# Patient Record
Sex: Male | Born: 1980 | Race: White | Hispanic: No | Marital: Married | Smoking: Never smoker
Health system: Southern US, Community
[De-identification: ages and names within clinical notes are randomized; demographics above are authoritative.]

---

## 2000-05-24 ENCOUNTER — Encounter: Payer: Self-pay | Admitting: *Deleted

## 2000-05-24 ENCOUNTER — Observation Stay (HOSPITAL_COMMUNITY): Admission: EM | Admit: 2000-05-24 | Discharge: 2000-05-25 | Payer: Self-pay | Admitting: Emergency Medicine

## 2016-09-07 DIAGNOSIS — D485 Neoplasm of uncertain behavior of skin: Secondary | ICD-10-CM | POA: Diagnosis not present

## 2016-09-07 DIAGNOSIS — D229 Melanocytic nevi, unspecified: Secondary | ICD-10-CM | POA: Diagnosis not present

## 2016-09-25 DIAGNOSIS — D485 Neoplasm of uncertain behavior of skin: Secondary | ICD-10-CM | POA: Diagnosis not present

## 2016-10-30 DIAGNOSIS — L988 Other specified disorders of the skin and subcutaneous tissue: Secondary | ICD-10-CM | POA: Diagnosis not present

## 2016-10-30 DIAGNOSIS — D485 Neoplasm of uncertain behavior of skin: Secondary | ICD-10-CM | POA: Diagnosis not present

## 2016-11-13 DIAGNOSIS — D485 Neoplasm of uncertain behavior of skin: Secondary | ICD-10-CM | POA: Diagnosis not present

## 2017-03-26 ENCOUNTER — Other Ambulatory Visit: Payer: Self-pay | Admitting: Internal Medicine

## 2017-03-26 DIAGNOSIS — R1032 Left lower quadrant pain: Secondary | ICD-10-CM

## 2017-03-26 DIAGNOSIS — Z23 Encounter for immunization: Secondary | ICD-10-CM | POA: Diagnosis not present

## 2017-04-05 ENCOUNTER — Ambulatory Visit
Admission: RE | Admit: 2017-04-05 | Discharge: 2017-04-05 | Disposition: A | Discharge status: Home / Self Care | Payer: BLUE CROSS/BLUE SHIELD | Source: Ambulatory Visit | Attending: Internal Medicine | Admitting: Internal Medicine

## 2017-04-05 DIAGNOSIS — R1032 Left lower quadrant pain: Secondary | ICD-10-CM | POA: Diagnosis not present

## 2017-04-05 MED ORDER — IOPAMIDOL (ISOVUE-300) INJECTION 61%
100.0000 mL | Freq: Once | INTRAVENOUS | Status: AC | PRN
  Administered 2017-04-05: 100 mL via INTRAVENOUS

## 2017-07-18 ENCOUNTER — Encounter: Payer: Self-pay | Admitting: Medical

## 2017-07-18 ENCOUNTER — Ambulatory Visit: Payer: Self-pay | Admitting: Medical

## 2017-07-18 VITALS — BP 115/80 | HR 60 | Temp 97.2°F | Resp 16 | Ht 72.0 in | Wt 196.0 lb

## 2017-07-18 DIAGNOSIS — J0100 Acute maxillary sinusitis, unspecified: Secondary | ICD-10-CM

## 2017-07-18 DIAGNOSIS — H6983 Other specified disorders of Eustachian tube, bilateral: Secondary | ICD-10-CM

## 2017-07-18 MED ORDER — PREDNISONE 10 MG (21) PO TBPK: ORAL_TABLET | ORAL | 0 refills | Status: AC

## 2017-07-18 MED ORDER — AMOXICILLIN-POT CLAVULANATE 875-125 MG PO TABS: 1.0000 | ORAL_TABLET | Freq: Two times a day (BID) | ORAL | 0 refills | Status: AC

## 2017-07-18 NOTE — Patient Instructions (Signed)
Eustachian Tube Dysfunction The eustachian tube connects the middle ear to the back of the nose. It regulates air pressure in the middle ear by allowing air to move between the ear and nose. It also helps to drain fluid from the middle ear space. When the eustachian tube does not function properly, air pressure, fluid, or both can build up in the middle ear. Eustachian tube dysfunction can affect one or both ears. What are the causes? This condition happens when the eustachian tube becomes blocked or cannot open normally. This may result from:  Ear infections.  Colds and other upper respiratory infections.  Allergies.  Irritation, such as from cigarette smoke or acid from the stomach coming up into the esophagus (gastroesophageal reflux).  Sudden changes in air pressure, such as from descending in an airplane.  Abnormal growths in the nose or throat, such as nasal polyps, tumors, or enlarged tissue at the back of the throat (adenoids).  What increases the risk? This condition may be more likely to develop in people who smoke and people who are overweight. Eustachian tube dysfunction may also be more likely to develop in children, especially children who have:  Certain birth defects of the mouth, such as cleft palate.  Large tonsils and adenoids.  What are the signs or symptoms? Symptoms of this condition may include:  A feeling of fullness in the ear.  Ear pain.  Clicking or popping noises in the ear.  Ringing in the ear.  Hearing loss.  Loss of balance.  Symptoms may get worse when the air pressure around you changes, such as when you travel to an area of high elevation or fly on an airplane. How is this diagnosed? This condition may be diagnosed based on:  Your symptoms.  A physical exam of your ear, nose, and throat.  Tests, such as those that measure: ? The movement of your eardrum (tympanogram). ? Your hearing (audiometry).  How is this treated? Treatment  depends on the cause and severity of your condition. If your symptoms are mild, you may be able to relieve your symptoms by moving air into ("popping") your ears. If you have symptoms of fluid in your ears, treatment may include:  Decongestants.  Antihistamines.  Nasal sprays or ear drops that contain medicines that reduce swelling (steroids).  In some cases, you may need to have a procedure to drain the fluid in your eardrum (myringotomy). In this procedure, a small tube is placed in the eardrum to:  Drain the fluid.  Restore the air in the middle ear space.  Follow these instructions at home:  Take over-the-counter and prescription medicines only as told by your health care provider.  Use techniques to help pop your ears as recommended by your health care provider. These may include: ? Chewing gum. ? Yawning. ? Frequent, forceful swallowing. ? Closing your mouth, holding your nose closed, and gently blowing as if you are trying to blow air out of your nose.  Do not do any of the following until your health care provider approves: ? Travel to high altitudes. ? Fly in airplanes. ? Work in a pressurized cabin or room. ? Scuba dive.  Keep your ears dry. Dry your ears completely after showering or bathing.  Do not smoke.  Keep all follow-up visits as told by your health care provider. This is important. Contact a health care provider if:  Your symptoms do not go away after treatment.  Your symptoms come back after treatment.  You are   unable to pop your ears.  You have: ? A fever. ? Pain in your ear. ? Pain in your head or neck. ? Fluid draining from your ear.  Your hearing suddenly changes.  You become very dizzy.  You lose your balance. This information is not intended to replace advice given to you by your health care provider. Make sure you discuss any questions you have with your health care provider. Document Released: 03/05/2015 Document Revised: 07/15/2015  Document Reviewed: 02/25/2014 Elsevier Interactive Patient Education  2018 [employer]. Allergies An allergy is when your body reacts to a substance in a way that is not normal. An allergic reaction can happen after you:  Eat something.  Breathe in something.  Touch something.  You can be allergic to:  Things that are only around during certain seasons, like molds and pollens.  Foods.  Drugs.  Insects.  Animal dander.  What are the signs or symptoms?  Puffiness (swelling). This may happen on the lips, face, tongue, mouth, or throat.  Sneezing.  Coughing.  Breathing loudly (wheezing).  Stuffy nose.  Tingling in the mouth.  A rash.  Itching.  Itchy, red, puffy areas of skin (hives).  Watery eyes.  Throwing up (vomiting).  Watery poop (diarrhea).  Dizziness.  Feeling faint or fainting.  Trouble breathing or swallowing.  A tight feeling in the chest.  A fast heartbeat. How is this diagnosed? Allergies can be diagnosed with:  A medical and family history.  Skin tests.  Blood tests.  A food diary. A food diary is a record of all the foods, drinks, and symptoms you have each day.  The results of an elimination diet. This diet involves making sure not to eat certain foods and then seeing what happens when you start eating them again.  How is this treated? There is no cure for allergies, but allergic reactions can be treated with medicine. Severe reactions usually need to be treated at a hospital. How is this prevented? The best way to prevent an allergic reaction is to avoid the thing you are allergic to. Allergy shots and medicines can also help prevent reactions in some cases. This information is not intended to replace advice given to you by your health care provider. Make sure you discuss any questions you have with your health care provider. Document Released: 06/03/2012 Document Revised: 10/04/2015 Document Reviewed: 11/18/2013 Elsevier  Interactive Patient Education  2018 [employer]. Sinusitis, Adult Sinusitis is soreness and inflammation of your sinuses. Sinuses are hollow spaces in the bones around your face. They are located:  Around your eyes.  In the middle of your forehead.  Behind your nose.  In your cheekbones.  Your sinuses and nasal passages are lined with a stringy fluid (mucus). Mucus normally drains out of your sinuses. When your nasal tissues get inflamed or swollen, the mucus can get trapped or blocked so air cannot flow through your sinuses. This lets bacteria, viruses, and funguses grow, and that leads to infection. Follow these instructions at home: Medicines  Take, use, or apply over-the-counter and prescription medicines only as told by your doctor. These may include nasal sprays.  If you were prescribed an antibiotic medicine, take it as told by your doctor. Do not stop taking the antibiotic even if you start to feel better. Hydrate and Humidify  Drink enough water to keep your pee (urine) clear or pale yellow.  Use a cool mist humidifier to keep the humidity level in your home above 50%.  Breathe in steam for 10-15 minutes, 3-4 times a day or as told by your doctor. You can do this in the bathroom while a hot shower is running.  Try not to spend time in cool or dry air. Rest  Rest as much as possible.  Sleep with your head raised (elevated).  Make sure to get enough sleep each night. General instructions  Put a warm, moist washcloth on your face 3-4 times a day or as told by your doctor. This will help with discomfort.  Wash your hands often with soap and water. If there is no soap and water, use hand sanitizer.  Do not smoke. Avoid being around people who are smoking (secondhand smoke).  Keep all follow-up visits as told by your doctor. This is important. Contact a doctor if:  You have a fever.  Your symptoms get worse.  Your symptoms do not get better within 10 days. Get  help right away if:  You have a very bad headache.  You cannot stop throwing up (vomiting).  You have pain or swelling around your face or eyes.  You have trouble seeing.  You feel confused.  Your neck is stiff.  You have trouble breathing. This information is not intended to replace advice given to you by your health care provider. Make sure you discuss any questions you have with your health care provider. Document Released: 07/26/2007 Document Revised: 10/03/2015 Document Reviewed: 12/02/2014 Elsevier Interactive Patient Education  [employer].

## 2017-07-18 NOTE — Progress Notes (Addendum)
   Subjective:    Patient ID: Robert Garrison, male    DOB: 02-05-1981, 37 y.o.   MRN: [id number]  HPI 37 yo male in non acute distress, presents today with  About one month ago, sinus congestion  Coughing up green only in the morning, no green discharge through out the day.  Started with Zyrtec two days ago. Feels like he is clogged up behind his nose.  Otherwise feels fine and is able to workout without problems.   Review of Systems  Constitutional: Positive for fever (Monday). Negative for chills and fatigue.  HENT: Positive for congestion and sore throat (scatchy today). Negative for ear discharge, ear pain, postnasal drip, sinus pressure, sinus pain and voice change.   Eyes: Negative for discharge and itching.  Respiratory: Negative for cough and shortness of breath.   Cardiovascular: Negative for chest pain and leg swelling.  Gastrointestinal: Negative for abdominal pain.  Endocrine: Negative for polydipsia, polyphagia and polyuria.  Genitourinary: Negative for dysuria.  Musculoskeletal: Negative for myalgias.  Skin: Negative for rash.  Allergic/Immunologic: Negative for environmental allergies and food allergies.  Neurological: Negative for dizziness, syncope and light-headedness.  Psychiatric/Behavioral: Negative for behavioral problems, self-injury and suicidal ideas.       Objective:   Physical Exam  Constitutional: He is oriented to person, place, and time. He appears well-developed and well-nourished.  HENT:  Head: Normocephalic and atraumatic.  Right Ear: Hearing, external ear and ear canal normal. A middle ear effusion is present.  Left Ear: Hearing, external ear and ear canal normal. A middle ear effusion is present.  Nose: Mucosal edema present.  Mouth/Throat: Oropharynx is clear and moist.  Eyes: Pupils are equal, round, and reactive to light. EOM and lids are normal. Right conjunctiva is injected. Left conjunctiva is injected.    Neck: Normal range of motion.  Neck supple.  Cardiovascular: Normal rate, regular rhythm and normal heart sounds.  Pulmonary/Chest: Effort normal and breath sounds normal.  Lymphadenopathy:    He has no cervical adenopathy.  Neurological: He is alert and oriented to person, place, and time.  Skin: Skin is warm and dry.  Psychiatric: He has a normal mood and affect. His behavior is normal. Judgment and thought content normal.  Nursing note and vitals reviewed.         Assessment & Plan:  Sinusitis that looks viral. Eustachian tube dysfunction bilateral.. Allergies - he works outside all day long.  Meds ordered this encounter  Medications  . amoxicillin-clavulanate (AUGMENTIN) 875-125 MG tablet    Sig: Take 1 tablet by mouth 2 (two) times daily.    Dispense:  20 tablet    Refill:  0  . predniSONE (STERAPRED UNI-PAK 21 TAB) 10 MG (21) TBPK tablet    Sig: Take 6 tablets by mouth today that 5 tablets tomorrow then one less each day thereafter.    Dispense:  21 tablet    Refill:  0  To start antibiotics if fever 100.5 or facial pain or green discharge.  He is traveling this weekend and wants to feel better and not worse so thought he would come in and get evaluated. Patient verbalizes understanding and has no questions at discharge. Return to clinic as needed.

## 2017-12-27 DIAGNOSIS — H20011 Primary iridocyclitis, right eye: Secondary | ICD-10-CM | POA: Diagnosis not present

## 2017-12-27 DIAGNOSIS — H5711 Ocular pain, right eye: Secondary | ICD-10-CM | POA: Diagnosis not present

## 2018-02-15 DIAGNOSIS — H20011 Primary iridocyclitis, right eye: Secondary | ICD-10-CM | POA: Diagnosis not present

## 2018-08-30 IMAGING — CT CT ABD-PELV W/ CM
1 of 2 series · 15 of 32 positions shown, 19 images · IV contrast (iopamidol)
Comparison: None.

CLINICAL DATA: LEFT lower quadrant, lower pelvic and LEFT
testicular pain with pain radiating down LEFT leg for 1 month, sits
a lot at work

EXAM:
CT ABDOMEN AND PELVIS WITH CONTRAST
TECHNIQUE: Multidetector CT imaging of the abdomen and pelvis was performed
using the standard protocol following bolus administration of
intravenous contrast. Sagittal and coronal MPR images reconstructed
from axial data set.
CONTRAST:  100mL [id number] IOPAMIDOL ([id number]-[id number]) INJECTION 61% IV.
Dilute oral contrast.

[Series 2: abd/pelvis w/cm · axial · 0.71mm/px · z∈[+541,+1051]mm · 15 of 112 slices shown, 19 images]
[im 5/112  soft-tissue]
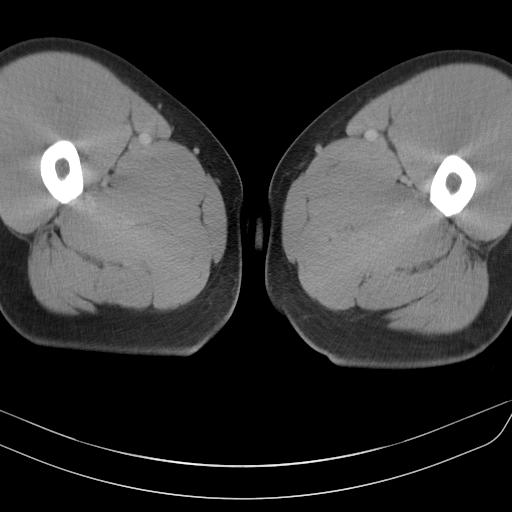
[im 5/112  bone]
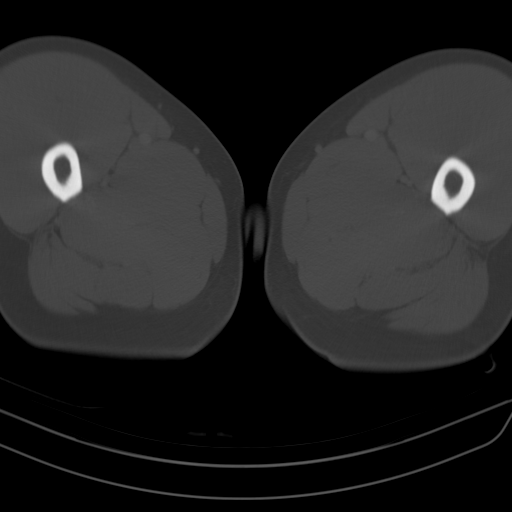
[im 14/112  soft-tissue]
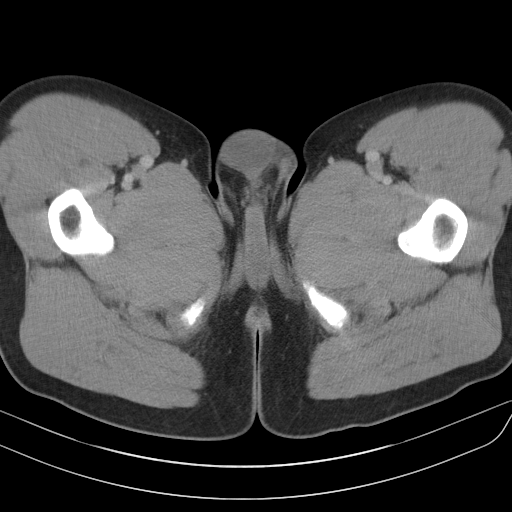
[im 23/112  soft-tissue]
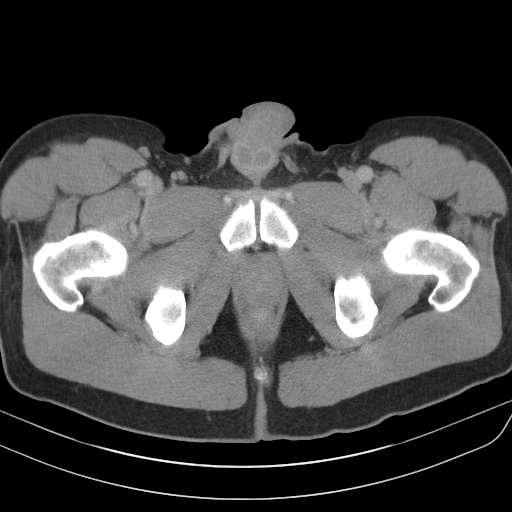
[im 32/112  soft-tissue]
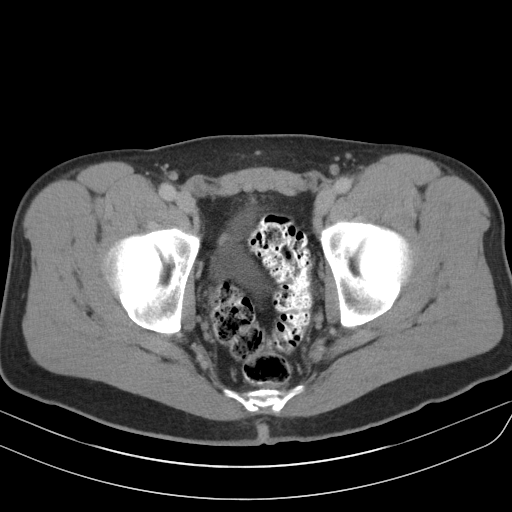
[im 40/112  soft-tissue]
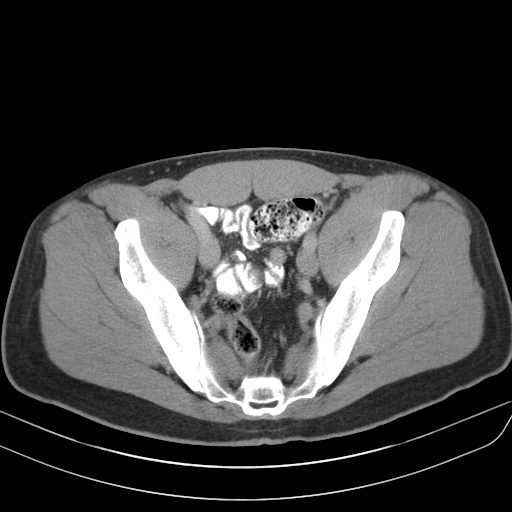
[im 49/112  soft-tissue]
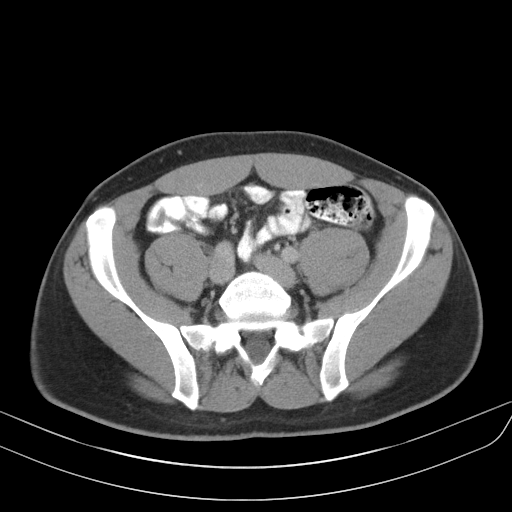
[im 58/112  soft-tissue]
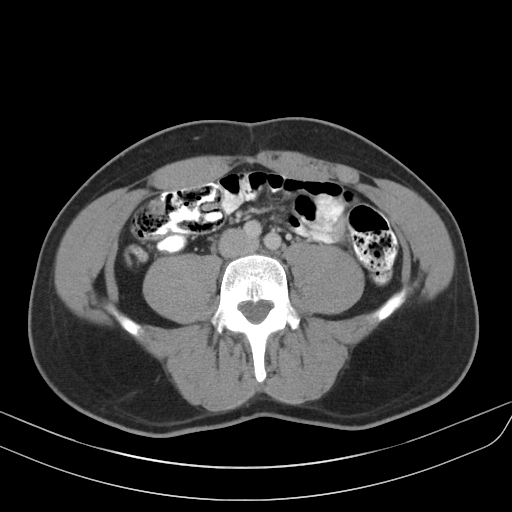
[im 63/112  soft-tissue]
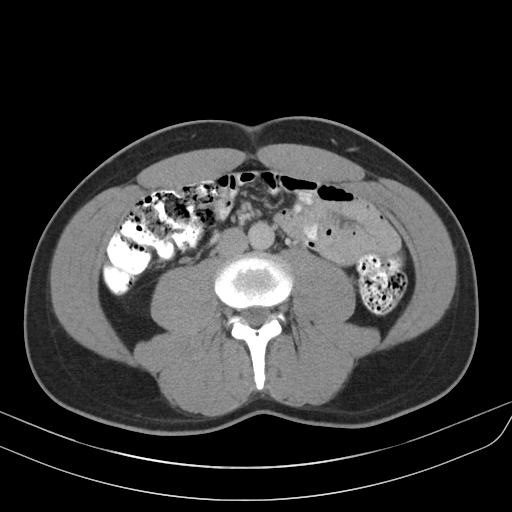
[im 72/112  soft-tissue]
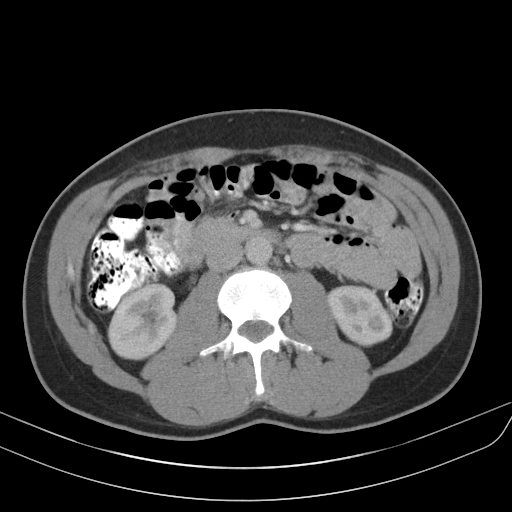
[im 72/112  bone]
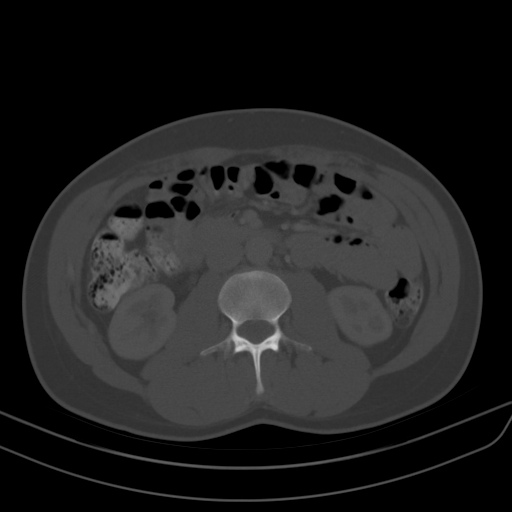
[im 80/112  soft-tissue]
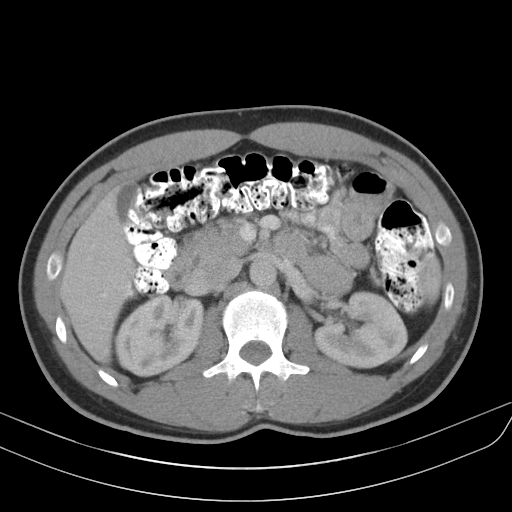
[im 89/112  soft-tissue]
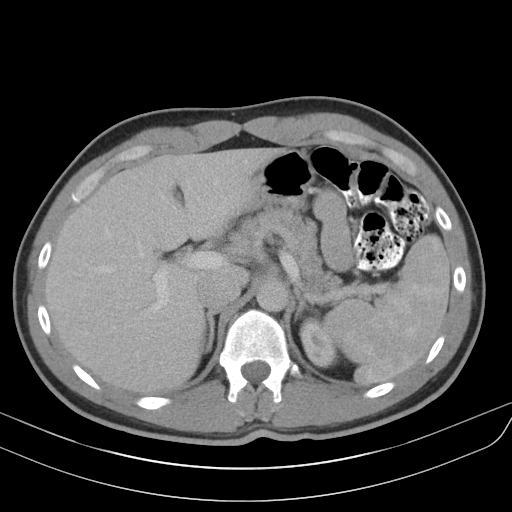
[im 94/112  lung]
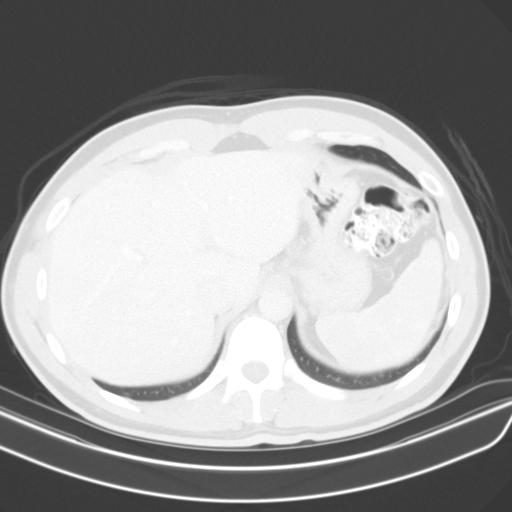
[im 98/112  soft-tissue]
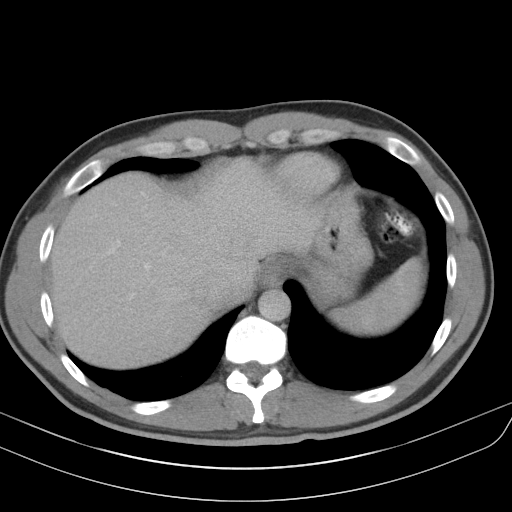
[im 98/112  lung]
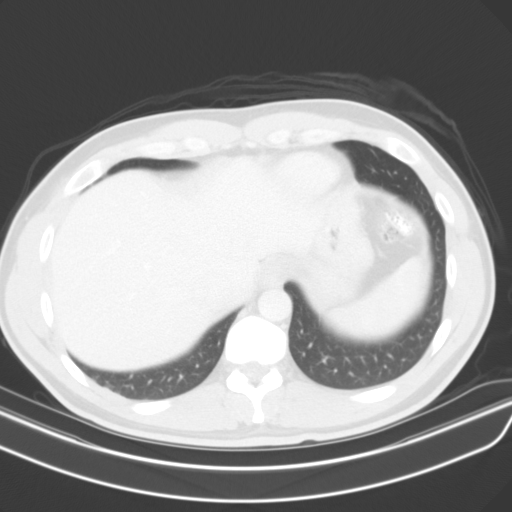
[im 103/112  lung]
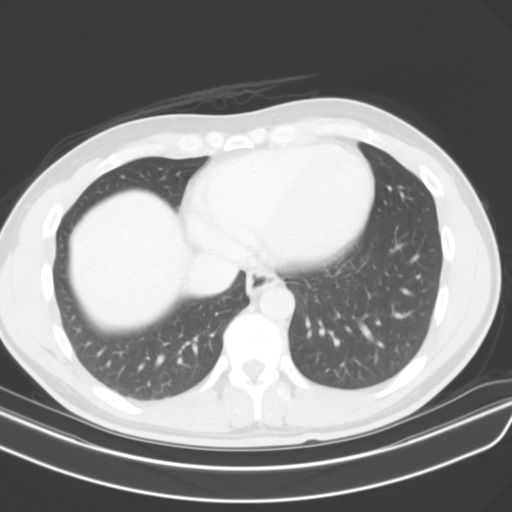
[im 107/112  soft-tissue]
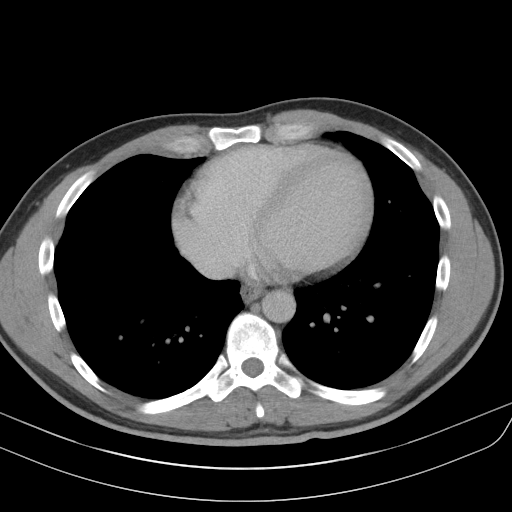
[im 107/112  lung]
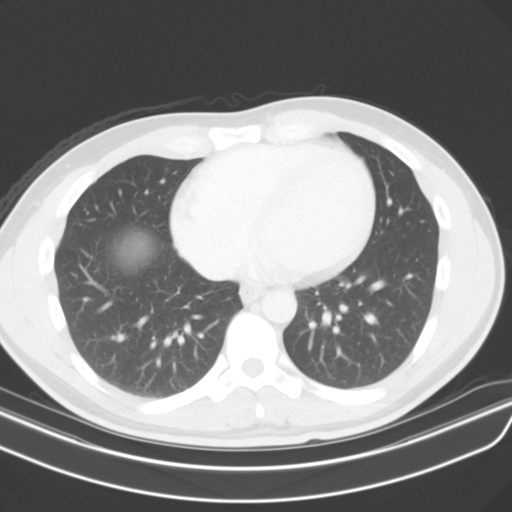

[15 of 32 positions shown; findings below may reference images not displayed]

FINDINGS: Lower chest: Lung bases clear

Hepatobiliary: 10 x 11 mm nodular density at the superior RIGHT lobe
liver with question single focus of minimal nodular peripheral
enhancement, nonspecific. Gallbladder and liver otherwise normal
appearance

Pancreas: Normal appearance

Spleen: Normal appearance

Adrenals/Urinary Tract: Adrenal glands, kidneys, ureters, and
bladder normal appearance

Stomach/Bowel: Normal appendix. Stomach and bowel loops normal
appearance.

Vascular/Lymphatic: Vascular structures unremarkable. No adenopathy.

Reproductive: Unremarkable prostate gland and seminal vesicles

Other: No free air, free fluid, hernia, or inflammatory process

Musculoskeletal: Unremarkable
IMPRESSION: No acute intra-abdominal or intrapelvic abnormalities identified to
account for patient's symptoms.

10 x 11 mm indeterminate lesion at the superior RIGHT lobe liver;
characterization by MR imaging recommended for further assessment.

This recommendation follows ACR consensus guidelines: Management of
Incidental Liver Lesions on CT: A White Paper of the ACR Incidental
Findings Committee. [HOSPITAL] 1363; [id number].
